# Patient Record
Sex: Male | Born: 2005 | Race: White | Hispanic: No | Marital: Single
Health system: Southern US, Community
[De-identification: ages and names within clinical notes are randomized; demographics above are authoritative.]

## PROBLEM LIST (undated history)

## (undated) DIAGNOSIS — J45909 Unspecified asthma, uncomplicated: Secondary | ICD-10-CM

---
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
## 2024-06-16 ENCOUNTER — Inpatient Hospital Stay

## 2024-06-16 ENCOUNTER — Encounter: Payer: Self-pay | Admitting: Genetic Counselor

## 2024-06-16 ENCOUNTER — Inpatient Hospital Stay: Attending: Hematology and Oncology | Admitting: Genetic Counselor

## 2024-06-16 DIAGNOSIS — Z809 Family history of malignant neoplasm, unspecified: Secondary | ICD-10-CM | POA: Diagnosis not present

## 2024-06-16 DIAGNOSIS — Z1379 Encounter for other screening for genetic and chromosomal anomalies: Secondary | ICD-10-CM | POA: Insufficient documentation

## 2024-06-16 DIAGNOSIS — Z8481 Family history of carrier of genetic disease: Secondary | ICD-10-CM | POA: Insufficient documentation

## 2024-06-16 NOTE — Progress Notes (Signed)
 REFERRING PROVIDER: Robynn Ip, MD [address] [place],  [place] [address]  PRIMARY PROVIDER:  Robynn Ip, MD  PRIMARY REASON FOR VISIT:  1. Family history of BRCA2 gene positive   2. Family history of cancer     HISTORY OF PRESENT ILLNESS:   Ryan Garza, a 18 y.o. 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  Ryan Garza presents to clinic today to discuss the possibility of a hereditary predisposition to cancer, to discuss genetic testing, and to further clarify his future cancer risks, as well as potential cancer risks for family members.   Ryan Garza is a 18 y.o. male with no personal history of cancer.     Past Medical History:  Diagnosis Date   Asthma     No past surgical history on file.  Social History   Socioeconomic History   Marital status: Single    Spouse name: Not on file   Number of children: Not on file   Years of education: Not on file   Highest education level: Not on file  Occupational History   Not on file  Tobacco Use   Smoking status: Not on file   Smokeless tobacco: Not on file  Substance and Sexual Activity   Alcohol use: Not on file   Drug use: Not on file   Sexual activity: Not on file  Other Topics Concern   Not on file  Social History Narrative   Not on file   Social Drivers of Health   Financial Resource Strain: Low Risk  (12/05/2023)   Received from [facility]   Overall Financial Resource Strain (CARDIA)    Difficulty of Paying Living Expenses: Not hard at all  Food Insecurity: No Food Insecurity (12/05/2023)   Received from [facility]   Hunger Vital Sign    Within the past 12 months, you worried that your food would run out before you got the money to buy more.: Never true    Within the past 12 months, the food you bought just didn't last and you didn't have money  to get more.: Never true  Transportation Needs: No Transportation Needs (12/05/2023)   Received from [facility]    Lack of Transportation (Medical): No    Lack of Transportation (Non-Medical): No  Physical Activity: Not on file  Stress: Not on file  Social Connections: Not on file     FAMILY HISTORY:  We obtained a detailed, 4-generation family history.  Significant diagnoses are listed below: Family History  Problem Relation Age of Onset   Breast cancer Mother    BRCA 1/2 Mother    Lung cancer Maternal Aunt 36   Colon cancer Maternal Grandfather 26   Ryan Garza's mother was diagnosed with breast cancer at age of 28 and underwent genetic testing with [facility] CancerNext-Expanded +RNAinsight panel that identified a pathogenic variant in BRCA2, Ex11_12dup, and a variant of unknown significance in MSH6. His maternal aunt was diagnosed with lung cancer at age 76 and has passed away. His maternal uncle has been diagnosed with colon polyps. His paternal grandfather was diagnosed with colon cancer at age 60 and passed away at age 75. He had two sisters diagnosed with breast cancer and a brother diagnosed with colon cancer, and a niece reported to have risk-reducing surgeries based on  results of genetic testing. Genetic test reports for extended family members were not available for review. Ryan Garza father is living at age 40, no history of cancer. His paternal uncle is living at age 32, no history of cancer. His paternal grandparents are living in their 63s, no history of cancer. His paternal grandmother has a sister diagnosed with breast cancer at age 38 and her father was diagnosed with prostate cancer at 45. His sister had breast cancer over 68 and his mother died of uterine cancer at 38. No paternal relatives have had genetic testing.     GENETIC COUNSELING ASSESSMENT: Ryan Garza is a 18 y.o. male with a family history of cancer and a pathogenic BRCA2 variant,  increasing his likelihood to have a hereditary cancer variant. We, therefore, discussed and recommended the following at today's visit.   DISCUSSION:  During the visit, we reviewed the natural history of BRCA2 mutations and increased risk for cancer including breast, male breast, ovarian, pancreatic, and prostate. We also discussed how the identification for a BRCA2 mutation would impact Ryan Garza's medical care, per current NCCN guidelines. We reviewed that BRCA2 mutations are inherited in an autosomal dominant pattern, meaning that children, siblings and parents of individuals with a BRCA2 mutation have a 1/2 or 50% chance to have the mutation as well. Both males and females can inherit a BRCA2 mutation and could pass it on to their children.   For men with a BRCA2, current Occupational Hygienist (NCCN) recommendations include:  Breast self-exam training and education at age 23 Clinical breast exam, every 12 months, starting at age 3  Consideration of annual mammogram at age 84, especially in individuals thought to have lifetime breast cancer risks up to 7% Annual prostate cancer screening with PSA starting at age 74  Pancreatic cancer screening beginning at age 3   Additionally, for any family members who are of childbearing age, identification of a BRCA2 mutation may have reproductive implications. In rare cases a child may inherit a BRCA2 mutation from each parent and have a condition called Fanconi Anemia (FA). FA is a childhood onset condition that can be associated with developmental differences, childhood cancer and bone marrow failure, among other health concerns. Therefore, if anyone of childbearing age is found to have a BRCA2 mutation, testing of their reproductive partner should be considered to clarify the chance to have a child with FA.  We discussed that testing is beneficial for several reasons, including knowing about other cancer risks, identifying potential  screening and risk-reduction options that may be appropriate, and to understanding if other family members could be at risk for cancer and allowing them to undergo genetic testing.  We reviewed the characteristics, features and inheritance patterns of hereditary cancer syndromes. We also discussed genetic testing, including the appropriate family members to test, the process of testing, insurance coverage and turn-around-time for results. We discussed the implications of a negative, positive, carrier and/or variant of uncertain significant result. We discussed that negative results would be uninformative given that Ryan Garza does not have a personal history of cancer. We recommended Ryan Garza pursue genetic testing for a panel that contains genes associated with the BRCA2 gene and possibly other genes associated with breast and prostate cancers.  Ryan Garza was offered single site analysis of BRCA2, common hereditary cancer panel (40+ genes) and an expanded pan-cancer panel (70+ genes). Ryan Garza was informed of the benefits and limitations of each panel, including that expanded pan-cancer panels contain several  genes that do not have clear management guidelines at this point in time.  We also discussed that as the number of genes included on a panel increases, the chances of variants of uncertain significance increases.  After considering the benefits and limitations of each gene panel, Ryan Garza expressed interest in [facility] CancerNext +RNAinsight panel, but declined to complete genetic testing today.   Based on Ryan Garza family history of cancer, he meets medical criteria for genetic testing. Though Ryan Garza is not personally affected, there are no affected family members that are willing/able to undergo hereditary cancer testing. Despite that he meets criteria, he may still have an out of pocket cost. We discussed that if his out of pocket cost for testing is over $100, the laboratory should contact them  to discuss self-pay prices, patient pay assistance programs, if applicable, and other billing options.  We discussed that some people do not want to undergo genetic testing due to fear of genetic discrimination.  A federal law called the Genetic Information Non-Discrimination Act (GINA) of 2008 helps protect individuals against genetic discrimination based on their genetic test results.  It impacts both health insurance and employment.  With health insurance, it protects against increased premiums, being kicked off insurance or being forced to take a test in order to be insured.  For employment it protects against hiring, firing and promoting decisions based on genetic test results.  GINA does not apply to those in the [employer], those who work for companies with less than 15 employees, and new life insurance or long-term disability insurance policies.  Health status due to a cancer diagnosis is not protected under GINA.  PLAN: After considering the risks, benefits, and limitations, Ryan Garza. [place] did not wish to pursue genetic testing at today's visit. He shared that hew would like to discuss these options with his mom in more detail, but may consider putting off testing until he is a bit older. We understand this decision and remain available to coordinate genetic testing at any time in the future. We, therefore, recommend Ryan Garza. [place] continue to follow the cancer screening guidelines given by his primary healthcare provider.  Based on Ryan Garza. Down family history, we recommended his paternal relative that have been diagnosed with cancer to consider genetic counseling and testing. Ryan Garza will let us  know if we can be of any assistance in coordinating genetic counseling for relatives.   Lastly, we encouraged Ryan Garza. [place] to remain in contact with cancer genetics annually so that we can continuously update the family history and inform him of any changes in cancer genetics and testing that may be of benefit for this  family.   Ryan Garza. [place] questions were answered to his satisfaction today. Our contact information was provided should additional questions or concerns arise. Thank you for the referral and allowing us  to share in the care of your patient.   Burnard Ogren, MS, [facility] Licensed, [email or web address] phone: [phone]   40 minutes were spent on the date of the encounter in service to the patient including preparation, face-to-face consultation, documentation and care coordination. The patient brought his paternal grandmother to the appointment.  Drs. Gudena and/or Lanny were available to discuss this case as needed.  _______________________________________________________________________ For Office Staff:  Number of people involved in session: 2 Was an Intern/ student involved with case: no
